# Patient Record
Sex: Male | Born: 1980 | Race: White | Hispanic: No | Marital: Married | Smoking: Never smoker
Health system: Southern US, Community
[De-identification: ages and names within clinical notes are randomized; demographics above are authoritative.]

## PROBLEM LIST (undated history)

## (undated) DIAGNOSIS — Z789 Other specified health status: Secondary | ICD-10-CM

## (undated) HISTORY — DX: Other specified health status: Z78.9

---

## 2017-01-04 ENCOUNTER — Encounter (INDEPENDENT_AMBULATORY_CARE_PROVIDER_SITE_OTHER): Payer: Self-pay

## 2017-01-11 ENCOUNTER — Encounter (INDEPENDENT_AMBULATORY_CARE_PROVIDER_SITE_OTHER): Payer: Self-pay | Admitting: Sports Medicine

## 2017-01-11 ENCOUNTER — Ambulatory Visit (INDEPENDENT_AMBULATORY_CARE_PROVIDER_SITE_OTHER): Payer: TRICARE Prime—HMO | Admitting: Sports Medicine

## 2017-01-11 VITALS — BP 139/86 | HR 69 | Temp 98.9°F | Ht 72.0 in | Wt 180.0 lb

## 2017-01-11 DIAGNOSIS — S73191A Other sprain of right hip, initial encounter: Secondary | ICD-10-CM

## 2017-01-11 DIAGNOSIS — M76891 Other specified enthesopathies of right lower limb, excluding foot: Secondary | ICD-10-CM

## 2017-01-11 MED ORDER — SODIUM HYALURONATE (VISCOSUP) 20 MG/2ML IX SOLN
20.0000 mg | INTRA_ARTICULAR | Status: AC
Start: 2017-01-11 — End: 2017-02-01
  Administered 2017-01-11: 15:00:00 20 mg via INTRA_ARTICULAR

## 2017-01-11 NOTE — Progress Notes (Signed)
[facility]      Provider: Zack Seal, MD  Date of Exam: 01/11/2017  Patient:  Brett Alexander  DOB: 18-Dec-1980  AGE: 36 y.o.  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 He recalls a history of twisting his hip 4-5 years ago while running, but states his pain from that event resolved. There was no new known inciting event, other than his marathon training. His pain is currently described as dull, aching and intermittent. It typically occurs when doing speed work. Light jogging and low impact exercise to not precipitate his symptoms. He reports associated weakness. He has tried physical therapy with a therapist in [place] as well as rest, ice, and NSAIDs with mild relief. He denies numbness, tingling, fevers, chills, redness. He has gotten an MRI of his hip which showed a labral tear and hamstring tendinitis.    Club/School Affiliation: none    Past Medical History:    Past Medical History:   Diagnosis Date   . Patient denies medical problems        Social History:    Social History   Substance Use Topics   . Smoking status: Never Smoker   . Smokeless tobacco: Never Used   . Alcohol use No       Family History:    Family History   Problem Relation Age of Onset   . Hypertension Mother        Past Surgical History:    Past Surgical History:   Procedure Laterality Date   . patient denies surgery         Medications:  No current outpatient prescriptions on file.    Current Facility-Administered Medications:   .  sodium hyaluronate (viscosup) (HYALGAN) injection 20 mg, 20 mg, Intra-articular, Weekly, Tanaiya Kolarik, Sydell Axon, MD, 20 mg at 01/11/17 1515    Allergies:  No Known Allergies    ROS:  All other systems were reviewed and are negative except as previously mentioned in the HPI.    EXAM:   BP 139/86   Pulse 69   Temp 98.9 F (37.2  C) (Oral)   Ht 1.829 m (6')   Wt 81.6 kg (180 lb)   BMI 24.41 kg/m   Constitutional: Pt is well-developed, well-nourished, and in no distress.   HENT:   Head: Normocephalic and atraumatic.   Eyes: Conjunctivae are normal.   Neck: Neck supple.   Cardiovascular: Normal rate  Pulmonary/Chest: Effort normal.   Neurological: Pt is alert and oriented to person, place, and time.   Skin: Skin is warm and dry. No rash noted. Pt is not diaphoretic.   Psychiatric: Affect normal.     Right Hip  Gait:  Normal  Observation:  normal  Range of Motion:  Flexion 120 Deg; Extension 30 Deg; Abduction 45 Deg; Internal Rotation 35 Deg; External Rotation 65 Deg  Palpation:  nontender at Gluteal region, Femoral Triangle, Greater Trochanter, Iliac Crest, ASIS, AIIS, Ischial tuberosity, Pubic Ramus, Pubic Symphysis  Leg Lengths: equal  Distal Sensory:  normal  Strength Testing: normal  Distal pulses:  normal   FABER:  negative  FADIR:  Mildly positive  Ober's:  negative     STUDIES:   I have obtained and reviewed the MRI of his right hip without  contrast, which shows a tear in the acetabular labrum and hamstring tendinitis.    ASSESSMENT/PLAN:   1. Tear of right acetabular labrum, initial encounter  Ambulatory referral to Physical Therapy   2. Hamstring tendinitis of right thigh  Ambulatory referral to Physical Therapy     Brett Alexander is a very pleasant 36 y.o. yo male with right hip pain, acetabular labral tear, and mild hamstring tendinopathy. We had a lengthy discussion about the various treatment options for this problem, including both the potential operative and non-operative treatments. The risks, benefits, alternatives, and nature of any potential procedures for the problem were also discussed. He has tried physical therapy, rest, NSAIDs and other conservative measures. Brett Alexander is not interested in surgical treatment options at this time. He would like to proceed with a trial of a cortisone injection into his hip to see if this  helps. The option of PRP and its experimental nature was also discussed today. He may consider this in the future.    Recommendations:  Relative rest and activity modification  ice / heat as needed for comfort  Physical therapy - Rx provided   Rest from running  Follow up for cortisone injection under u/s-guidance at [facility] at his convenience    Teena Dunk, MD [facility]  Primary Care Sports Medicine Physician  [facility]

## 2017-01-19 ENCOUNTER — Encounter (INDEPENDENT_AMBULATORY_CARE_PROVIDER_SITE_OTHER): Payer: Self-pay | Admitting: Sports Medicine

## 2017-01-19 ENCOUNTER — Ambulatory Visit (INDEPENDENT_AMBULATORY_CARE_PROVIDER_SITE_OTHER): Payer: TRICARE Prime—HMO | Admitting: Sports Medicine

## 2017-01-19 VITALS — BP 138/77 | HR 69 | Ht 72.0 in | Wt 180.0 lb

## 2017-01-19 DIAGNOSIS — S73191D Other sprain of right hip, subsequent encounter: Secondary | ICD-10-CM

## 2017-01-19 MED ORDER — TRIAMCINOLONE ACETONIDE 40 MG/ML IJ SUSP
40.0000 mg | Freq: Once | INTRAMUSCULAR | Status: AC
Start: 2017-01-19 — End: 2017-01-19
  Administered 2017-01-19: 09:00:00 40 mg via INTRA_ARTICULAR

## 2017-01-19 NOTE — Progress Notes (Signed)
Ultrasound-guided Injection Procedure Note     Brief History: OUSMAN DISE is a very pleasant 36 y.o. yo male with right hip pain, acetabular labral tear.    Procedure:  Pre-procedure Diagnosis: right acetabular labral tear  Post-procedure Diagnosis: (same)  Indications: Treatment of symptomatic right hip pain; therapeutic purposes    Reason for ultrasound guidance: Required for localization of hip joint, avoidance of neurovascular structures, needle guidance during the injection, patient comfort.    Verbal informed consent was obtained prior to the procedure. Prior to this, the risks and side effects were discussed, including, but not limited to pain, infection, allergic reaction, bleeding, damage to tissues, vessels and nerves, skin atrophy, steroid flare.     Using the GE [employer] e [place] system, I performed a ULTRASOUND guided Injection of the right hip via an anterior in-plane approach. Localization of nearby nerves, tendons, and pertinent vasculature was performed prior to the procedure, taking care to avoid these structures. The site was prepped with chlorhexidine x2 and ethyl chloride spray. Injection was performed using needle localization with the curvilinear probe in long axis and a 22 ga spinal needle. 1.0 mL of 40mg /mL Kenalog and 2 mL of 1% lidocaine without epi was then injected into the joint. Permanent recording of images was performed. Pt reported mild to moderate relief immediately following the injection.    Complications: None; patient tolerated the procedure well.     Follow-up: Post-procedure education and instructions were provided. The patient was instructed to follow up in 6 weeks or sooner as needed for worsening symptoms.    Teena Dunk, MD [facility]  Primary Care Sports Medicine Physician  [facility]

## 2017-03-01 ENCOUNTER — Encounter (INDEPENDENT_AMBULATORY_CARE_PROVIDER_SITE_OTHER): Payer: Self-pay | Admitting: Sports Medicine

## 2017-03-01 ENCOUNTER — Ambulatory Visit (INDEPENDENT_AMBULATORY_CARE_PROVIDER_SITE_OTHER): Payer: TRICARE Prime—HMO | Admitting: Sports Medicine

## 2017-03-01 VITALS — BP 125/86 | HR 55 | Ht 72.0 in | Wt 180.0 lb

## 2017-03-01 DIAGNOSIS — M76891 Other specified enthesopathies of right lower limb, excluding foot: Secondary | ICD-10-CM

## 2017-03-01 DIAGNOSIS — S73191D Other sprain of right hip, subsequent encounter: Secondary | ICD-10-CM

## 2017-03-01 NOTE — Progress Notes (Signed)
[facility]      Provider: Zack Seal, MD  Date of Exam: 03/01/2017  Patient:  Brett Alexander  DOB: 05-25-1981  AGE: 36 y.o.  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 At his last visit in June, he was given an intra-articular cortisone injection. He feels as though this, along with the PT, have helped with his symptoms. In fact, he denies any pain currently. However, he still feels as though his hip is "tight" at times. He is back to running about 40 miles per week without pain. His goal is to run the [facility].    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 He recalls a history of twisting his hip 4-5 years ago while running, but states his pain from that event resolved. There was no new known inciting event, other than his marathon training. His pain is currently described as dull, aching and intermittent. It typically occurs when doing speed work. Light jogging and low impact exercise to not precipitate his symptoms. He reports associated weakness. He has tried physical therapy with a therapist in [place] as well as rest, ice, and NSAIDs with mild relief. He denies numbness, tingling, fevers, chills, redness. He has gotten an MRI of his hip which showed a labral tear and hamstring tendinitis.    Club/School Affiliation: none    Past Medical History:    Past Medical History:   Diagnosis Date   . Patient denies medical problems        Social History:    Social History   Substance Use Topics   . Smoking status: Never Smoker   . Smokeless tobacco: Never Used   . Alcohol use Yes      Comment: rarely       Family History:    Family History   Problem Relation Age of Onset   . Hypertension Mother        Past Surgical History:     Past Surgical History:   Procedure Laterality Date   . patient denies surgery         Medications:  No current outpatient prescriptions on file.    Allergies:  No Known Allergies    ROS:  All other systems were reviewed and are negative except as previously mentioned in the HPI.    EXAM:   BP 125/86   Pulse (!) 55   Ht 1.829 m (6')   Wt 81.6 kg (180 lb)   BMI 24.41 kg/m   Constitutional: Pt is well-developed, well-nourished, and in no distress.   HENT:   Head: Normocephalic and atraumatic.   Eyes: Conjunctivae are normal.   Neck: Neck supple.   Cardiovascular: Normal rate  Pulmonary/Chest: Effort normal.   Neurological: Pt is alert and oriented to person, place, and time.   Skin: Skin is warm and dry. No rash noted. Pt is not diaphoretic.   Psychiatric: Affect normal.     Right Hip  Gait:  Normal  Observation:  normal  Range of Motion:  Flexion 120 Deg; Extension 30 Deg; Abduction 45 Deg; Internal Rotation 35 Deg; External Rotation 65 Deg  Palpation:  nontender throughout  Leg Lengths: equal  Distal Sensory:  normal  Strength Testing: normal  Distal pulses:  normal   FABER:  negative  FADIR:  negative  Ober's:  negative     STUDIES:   Prior MRI of his right hip without contrast, shows a tear in the acetabular labrum and hamstring tendinitis.    ASSESSMENT/PLAN:   1. Tear of right acetabular labrum, subsequent encounter     2. Hamstring tendinitis of right thigh       Brett Alexander is a very pleasant 36 y.o. yo male with right hip pain, acetabular labral tear, and mild hamstring tendinopathy. He has made good progress with non-operative care.     Recommendations:  Relative rest and activity modification  ice / heat as needed for comfort  Continue PT/HEP  Low impact exercise encouraged  Follow up as needed    Teena Dunk, MD [facility]  Primary Care Sports Medicine Physician  [facility]

## 2017-09-04 ENCOUNTER — Ambulatory Visit (INDEPENDENT_AMBULATORY_CARE_PROVIDER_SITE_OTHER): Payer: TRICARE Prime—HMO | Admitting: Sports Medicine

## 2017-09-04 ENCOUNTER — Encounter (INDEPENDENT_AMBULATORY_CARE_PROVIDER_SITE_OTHER): Payer: Self-pay | Admitting: Sports Medicine

## 2017-09-04 VITALS — BP 121/74 | HR 64 | Ht 72.0 in | Wt 180.0 lb

## 2017-09-04 DIAGNOSIS — M25872 Other specified joint disorders, left ankle and foot: Secondary | ICD-10-CM

## 2017-09-04 NOTE — Progress Notes (Signed)
[facility]      Provider: Zack Seal, MD  Date of Exam: 09/04/2017  Patient:  Brett Alexander  DOB: [birth year]  AGE: 37 y.o.  MR#:  [id number]    CC: left ankle pain    HPI:  Brett Alexander is a 37 y.o.-year-old male who presents today for an evaluation of his left anterior ankle pain which started 2-3 weeks ago. His pain was achy and anterior. He has been doing his regular marathon training, but there was no known traumatic event. His pain lasted for a couple of days before resolving on its own. X-rays were performed by his PCP and showed an anterior bony spur. He was told to follow-up with sports medicine and is here seeking a second opinion about the possible need for surgery or other intervention.    Club/School Affiliation: none    Past Medical History:    Past Medical History:   Diagnosis Date   . Patient denies medical problems        Past Surgical History:    Past Surgical History:   Procedure Laterality Date   . patient denies surgery         Social History:   Social History   Substance Use Topics   . Smoking status: Never Smoker   . Smokeless tobacco: Never Used   . Alcohol use Yes      Comment: rarely       ROS:  All other systems were reviewed and are negative except as previously mentioned in the HPI.    EXAM:   BP 121/74   Pulse 64   Ht 1.829 m (6')   Wt 81.6 kg (180 lb)   BMI 24.41 kg/m   Constitutional: Pt is well-developed, well-nourished, and in no distress.   HENT:   Head: Normocephalic and atraumatic.   Eyes: Conjunctivae are normal.   Neck: Neck supple.   Cardiovascular: Normal rate  Pulmonary/Chest: Effort normal.   Neurological: Pt is alert and oriented to person, place, and time.   Skin: Skin is warm and dry. Pt is not diaphoretic.   Psychiatric: Affect normal.     Left Ankle  Observation: normal gait; no swelling, deformity or ecchymosis  Plantar Arches: pes planus  Palpation: Small bony spur palpated over the talus anteriorly; nontender over lateral  malleolus, medial malleolus, base of 5th Metatarsal, Lis Franc joint, ATFL, peroneal tendons, proximal Fibula, posterior tibial tendon, anterior tib-fib ligament, CFL, cuboid, achilles tendon, retrocalcaneal bursa  ROM: Dorsiflexion 15 Deg; Plantar Flexion 50 Deg ; Inversion 5 Deg; Eversion 5 Deg  Drawer Test: Negative  Cotton Test: negative  Tib-Fib Squeeze Test: negative  Talar Tilt Test: negative  External Rotation: negative  Dorsolateral Compression: negative  Strength testing:                                  Plantar flexion - 5 / 5                                  Eversion - 5 / 5                                  Inversion - 5 / 5  Dorsiflexion - 5 / 5   Thompson's Test: normal  Too Many Toes Sign: negative  Rise on Toe: normal heel varus  Distal Sensory: normal  Tinel's: negative over tarsal tunnel  Distal pulses:  Intact    STUDIES: None new. Prior x-rays of the left ankle reportedly showed an anterior bony spur, but were otherwise unremarkable. No records are currently available.    ASSESSMENT/PLAN:   1. Impingement of left ankle joint       Brett Alexander is a very pleasant 37 y.o. yo male with left anterior bony impingement, resolved. We had a lengthy discussion about the various treatment options for this problem, including both the potential operative and non-operative treatments. The risks, benefits, alternatives, and nature of any potential procedures for the problem were also discussed.    Recommendations:  Relative rest and activity modification  ice / heat as needed for comfort   Yoga, work on posterior heel cord flexibility  Work on gluteus medius/minimus strengthening  Dry needling as needed  Orthotics  Follow up as needed if symptoms return    Teena Dunk, MD, [facility]  Primary Care Sports Medicine Physician  [facility] Medicine

## 2021-10-04 IMAGING — MR MRI HIP RT WO CONTRAST
5 series · 40 of 40 positions shown · non-contrast
Comparison: None.

INDICATION: Right hip pain.
TECHNIQUE: Multiplanar, multiecho MR imaging of the hip labrum protocol was performed, including T1-weighted and fluid-sensitive sequences.

[Series 2: t1_cor_obl · coronal · right · 3.0mm · 0.56mm/px · 7 of 22 slices shown]
[im 1/22]
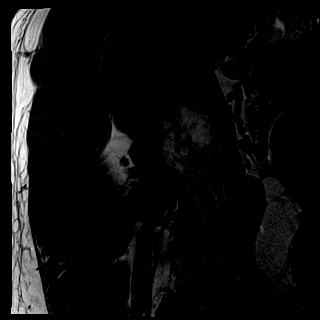
[im 4/22]
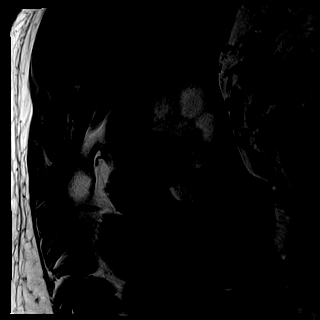
[im 8/22]
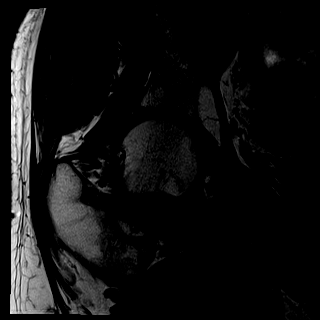
[im 11/22]
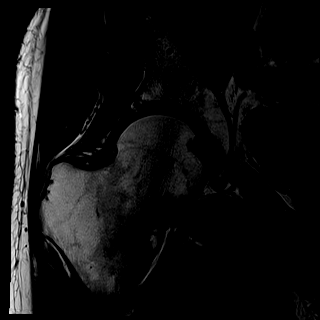
[im 15/22]
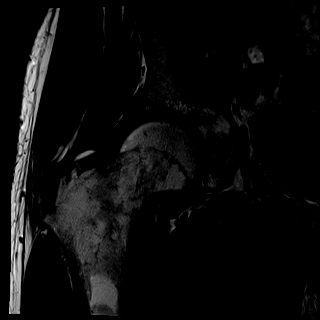
[im 18/22]
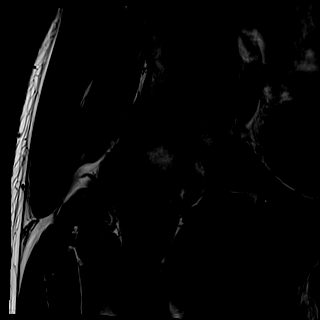
[im 22/22]
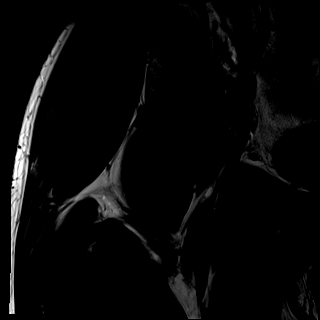

[Series 3: pd_cor_obl_fs · coronal · right · 3.0mm · 0.56mm/px · 6 of 22 slices shown]
[im 1/22]
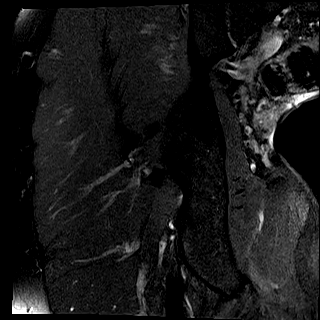
[im 5/22]
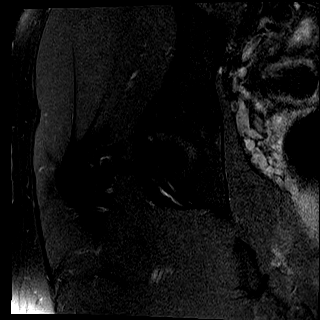
[im 9/22]
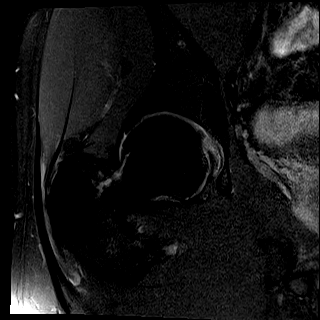
[im 13/22]
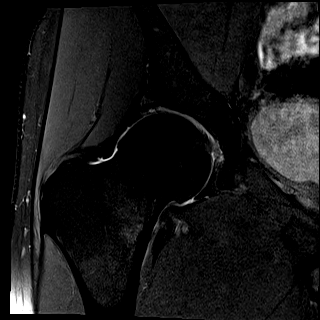
[im 17/22]
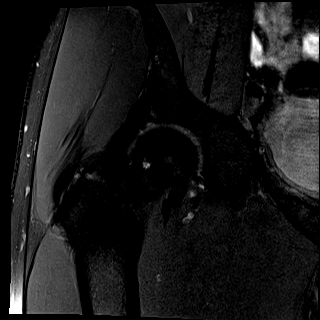
[im 22/22]
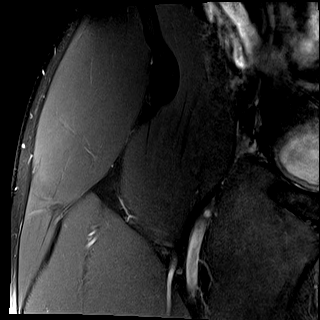

[Series 4: pd_sag_obl_fs · sagittal · right · 3.0mm · 0.56mm/px · 9 of 32 slices shown]
[im 1/32]
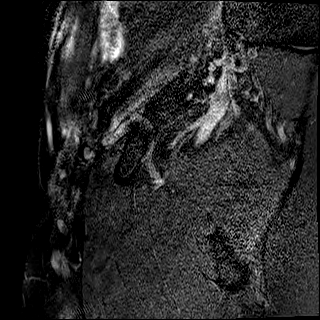
[im 4/32]
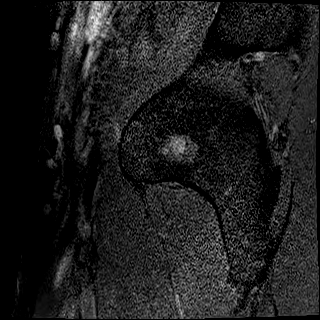
[im 8/32]
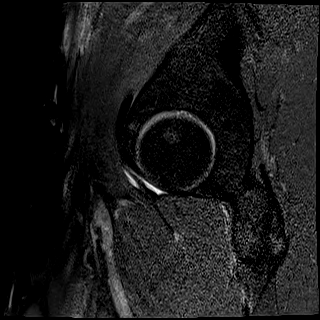
[im 12/32]
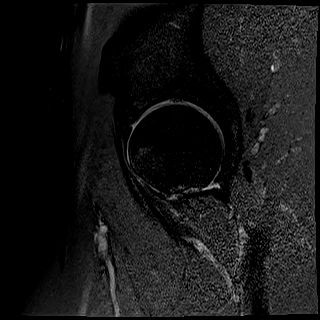
[im 16/32]
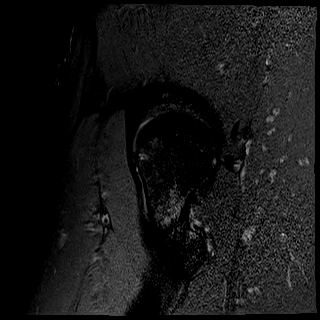
[im 20/32]
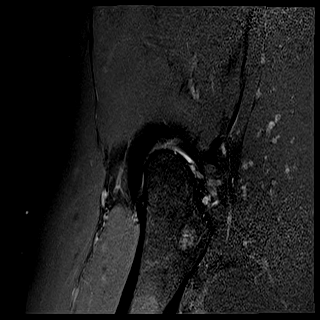
[im 24/32]
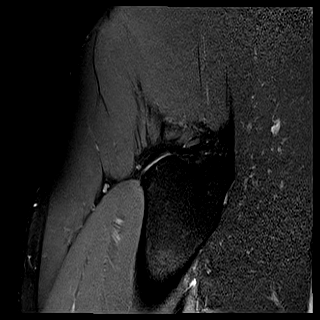
[im 28/32]
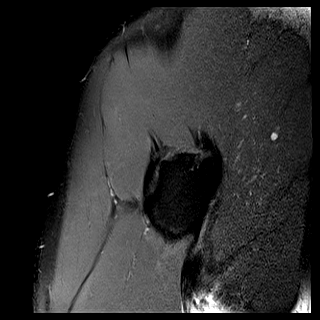
[im 32/32]
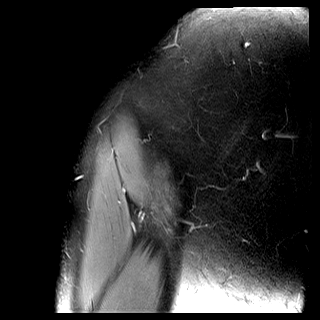

[Series 5: pd_axial_obl_fs · oblique · right · 3.0mm · 0.56mm/px · 6 of 22 slices shown]
[im 1/22]
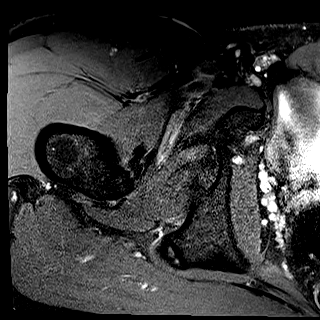
[im 5/22]
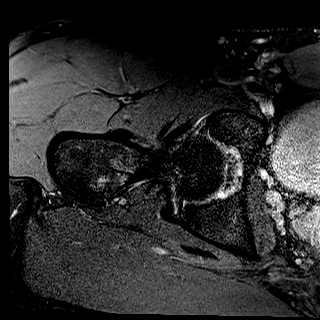
[im 9/22]
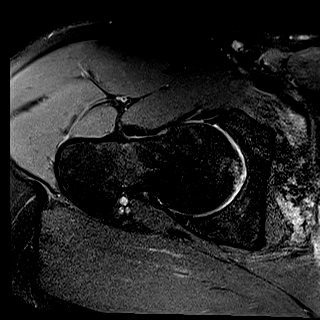
[im 13/22]
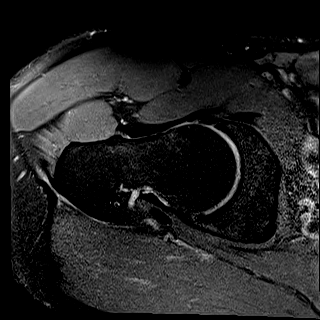
[im 17/22]
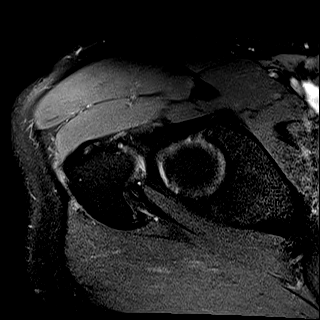
[im 22/22]
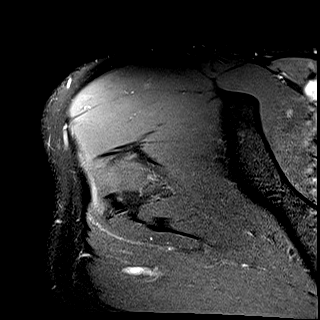

[Series 6: t2_axial_fs · axial · right · 4.0mm · 0.62mm/px · z∈[-76,+139]mm · 12 of 44 slices shown]
[im 1/44]
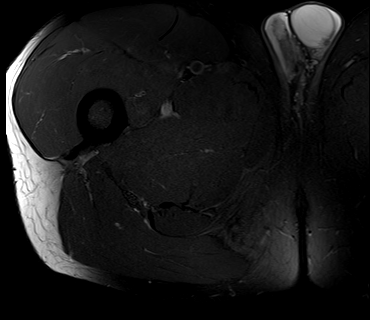
[im 4/44]
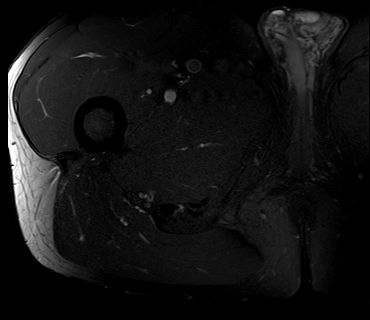
[im 8/44]
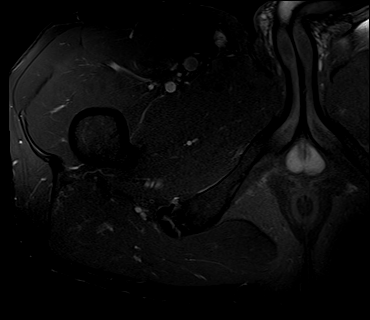
[im 12/44]
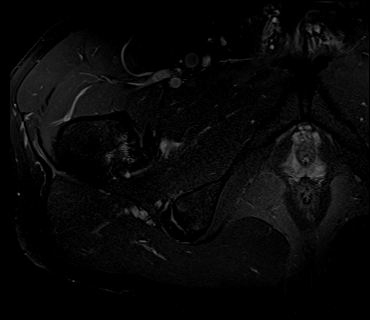
[im 16/44]
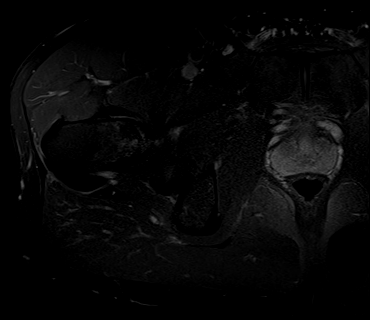
[im 20/44]
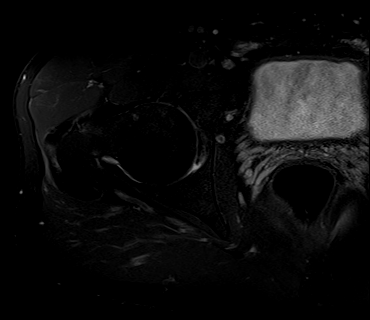
[im 24/44]
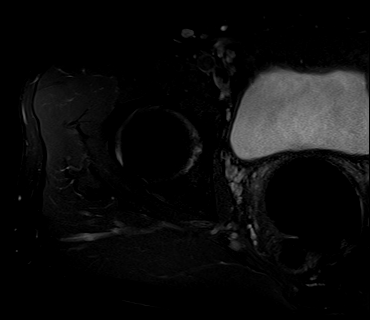
[im 28/44]
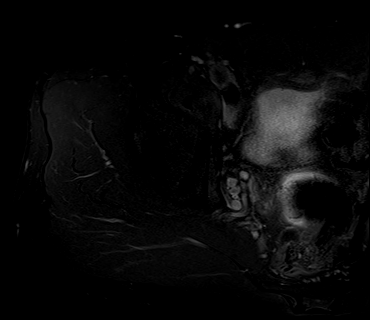
[im 32/44]
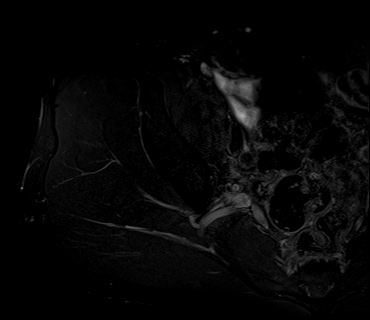
[im 36/44]
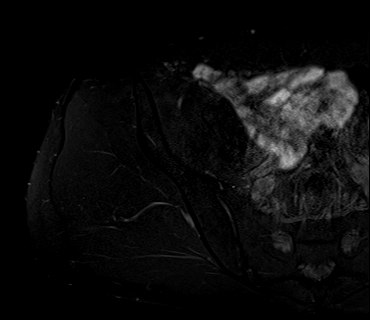
[im 40/44]
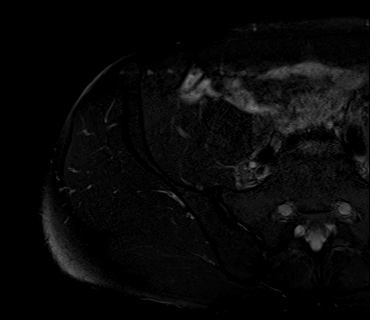
[im 44/44]
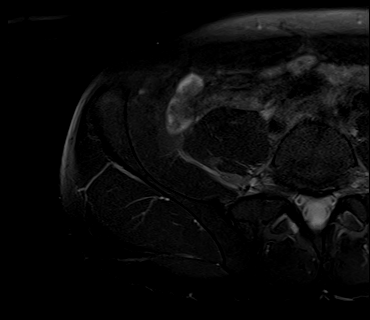

[40 of 40 positions shown; findings below may reference images not displayed]

FINDINGS: Acetabular labrum: Tear of the anterior superior labrum.

Cartilage: Small low-grade partial-thickness chondral defects at the anterosuperior acetabulum, at the chondral-labral junction.

Marrow: No fracture. No erosion. No AVN.

Osseous morphology: Mild cam morphology of the proximal right femur. Normal coverage of the femoral head by the acetabulum. Normal acetabular version.

Gluteal tendons and trochanteric bursa: Intact and normal in appearance.

Other visualized tendons: Low-grade partial-thickness tearing of the hamstring tendon origin. Iliopsoas and rectus femoris tendons are intact.

Soft tissues: Other visualized soft tissues are unremarkable.

No free fluid the pelvis. No right inguinal adenopathy or hernia.
IMPRESSION: 1.
Tear of the anterior superior right hip labrum.

2.
Mild right hip chondral abnormalities.

3.
Mild cam morphology of the proximal right femur.

4.
Low-grade partial-thickness tearing of the right hamstring tendon origin.

## 2023-03-02 IMAGING — MR MRI HIP LT WO CONTRAST
5 series · 40 of 40 positions shown · non-contrast
Comparison: None.

Images Obtained from [facility]
MRI HIP LT WO CONTRAST
INDICATION: Pain in left hip.
TECHNIQUE: Multiplanar, multiecho imaging of the left hip was performed, including T1-weighted and fluid sensitive sequences without intravenous contrast.

[Series 2: t1_cor · coronal · left · 4.0mm · 0.66mm/px · 7 of 28 slices shown]
[im 1/28]
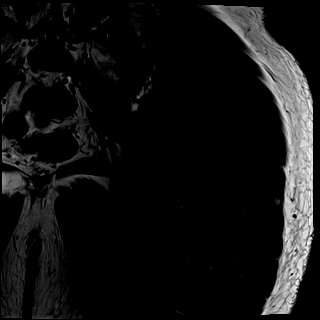
[im 5/28]
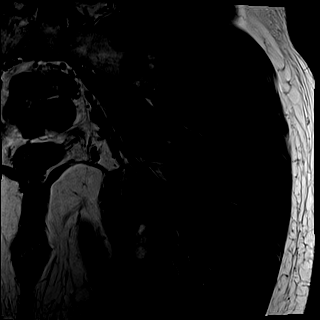
[im 10/28]
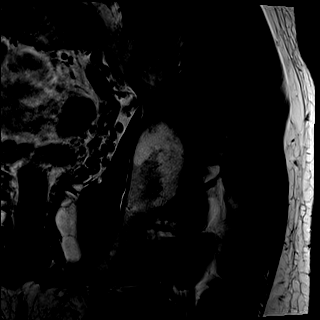
[im 14/28]
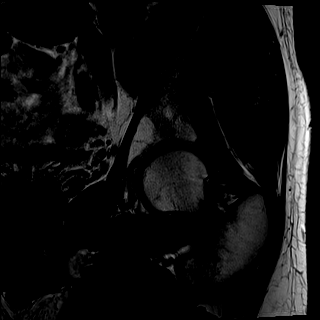
[im 19/28]
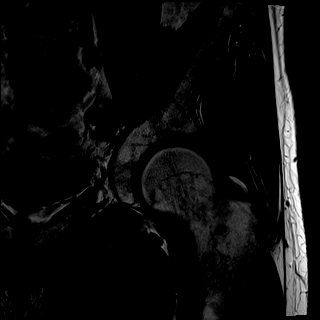
[im 23/28]
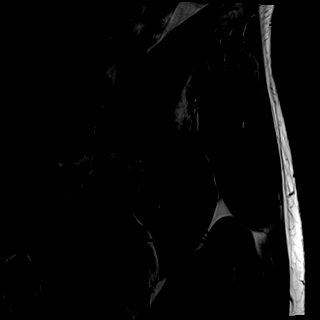
[im 28/28]
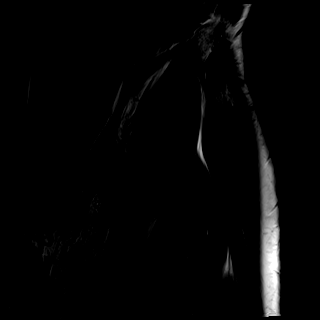

[Series 3: t2_cor_fs · coronal · left · 4.0mm · 0.66mm/px · 7 of 28 slices shown]
[im 1/28]
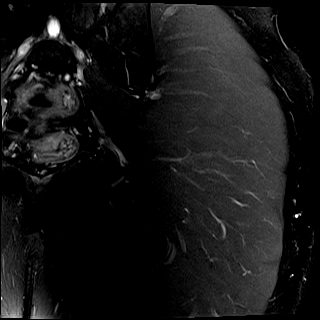
[im 5/28]
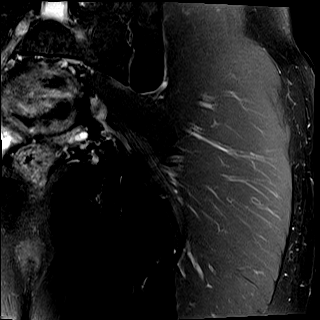
[im 10/28]
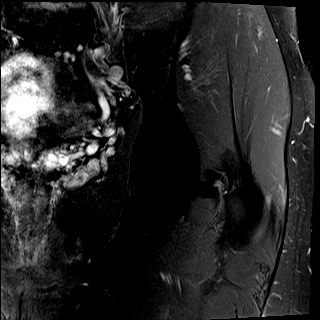
[im 14/28]
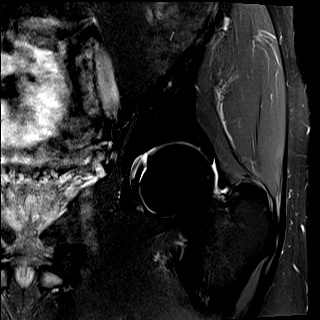
[im 19/28]
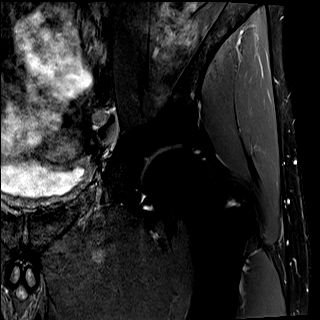
[im 23/28]
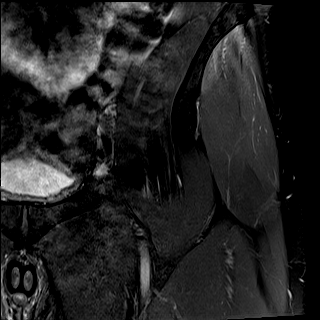
[im 28/28]
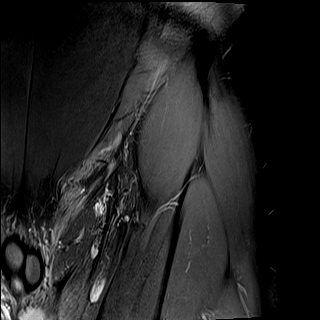

[Series 4: t2_axial_fs · axial · left · 4.0mm · 0.62mm/px · z∈[-102,+93]mm · 10 of 40 slices shown]
[im 1/40]
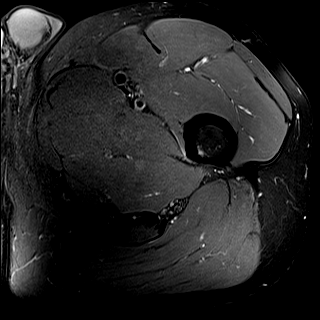
[im 5/40]
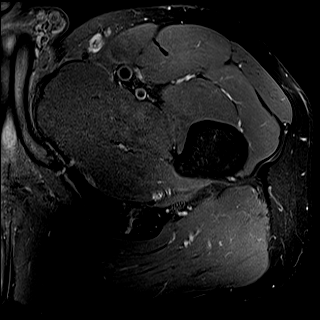
[im 9/40]
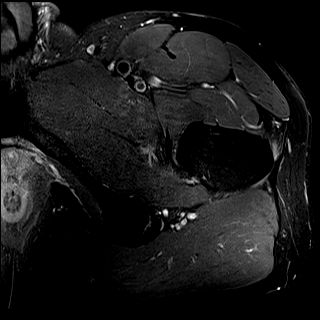
[im 14/40]
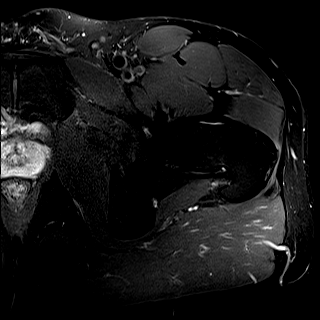
[im 18/40]
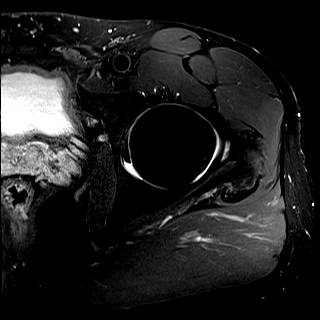
[im 22/40]
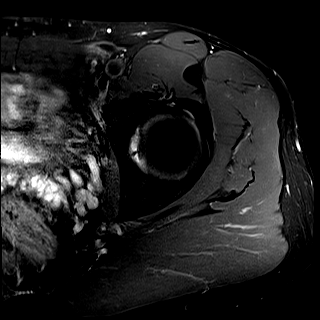
[im 27/40]
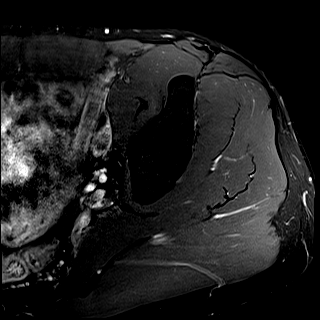
[im 31/40]
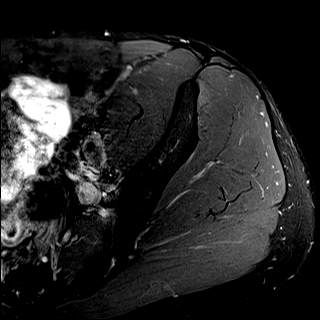
[im 35/40]
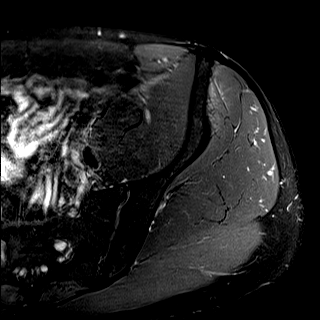
[im 40/40]
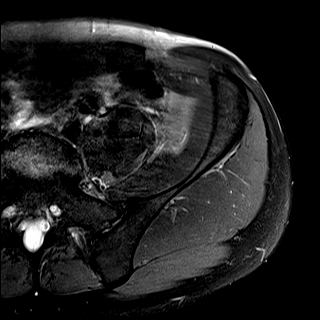

[Series 5: t1_axial · axial · left · 4.0mm · 0.62mm/px · z∈[-102,+93]mm · 10 of 40 slices shown]
[im 1/40]
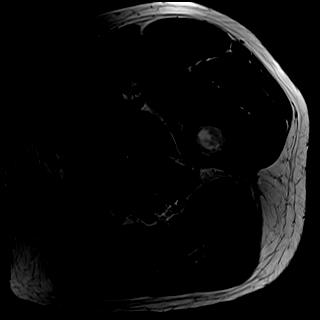
[im 5/40]
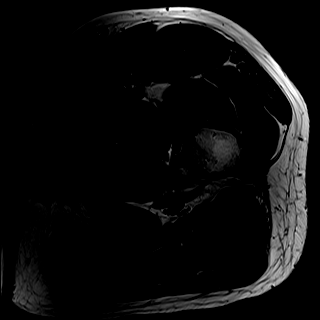
[im 9/40]
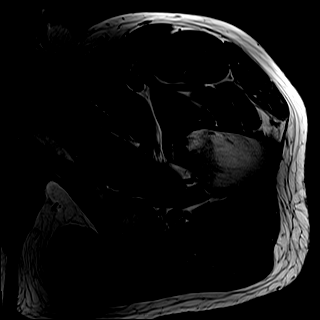
[im 14/40]
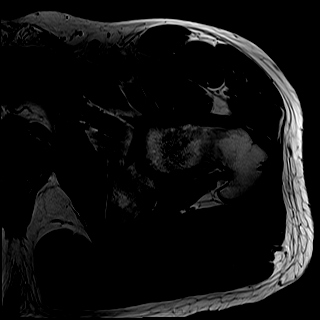
[im 18/40]
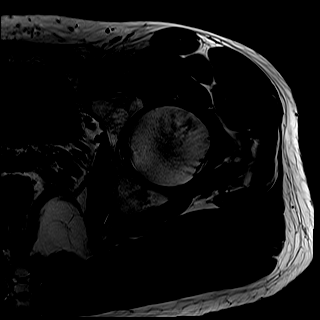
[im 22/40]
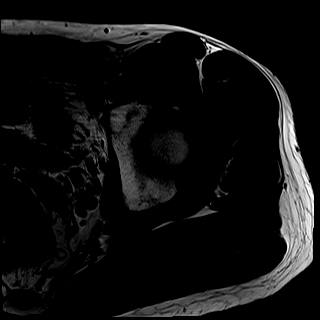
[im 27/40]
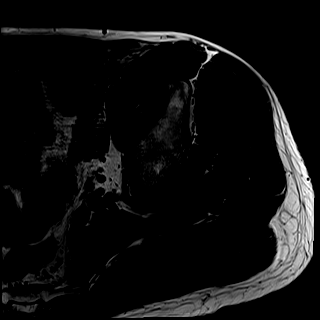
[im 31/40]
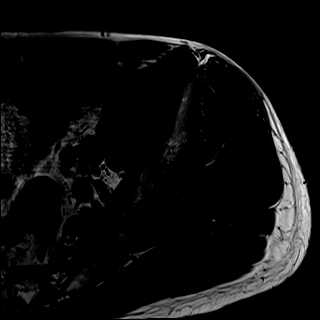
[im 35/40]
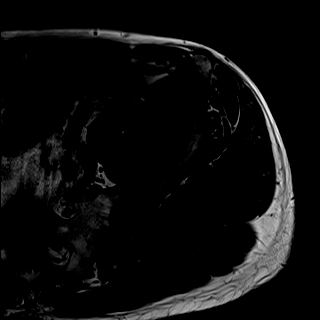
[im 40/40]
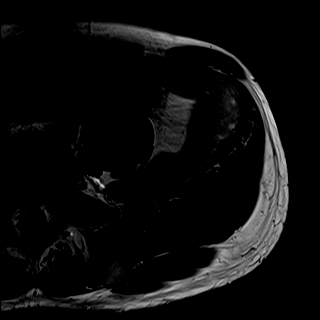

[Series 6: t2_sag_fs · sagittal · left · 4.0mm · 0.62mm/px · 6 of 26 slices shown]
[im 1/26]
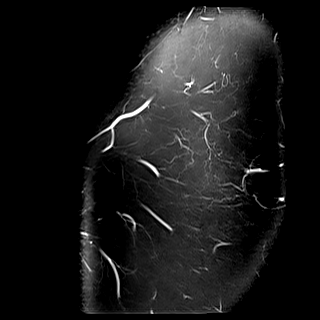
[im 6/26]
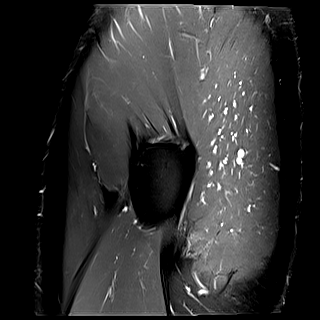
[im 11/26]
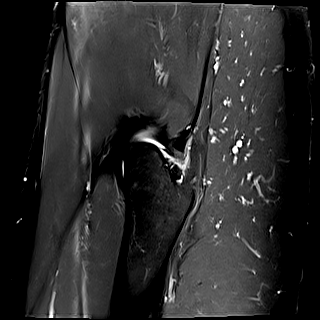
[im 16/26]
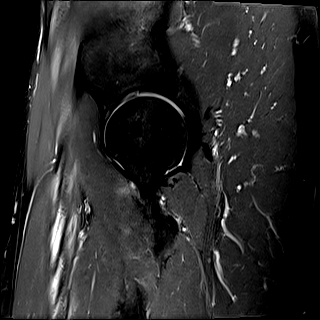
[im 21/26]
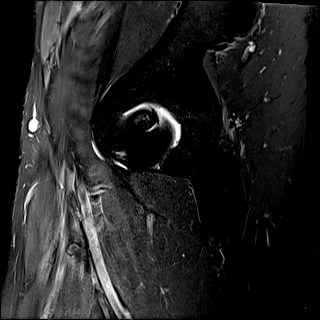
[im 26/26]
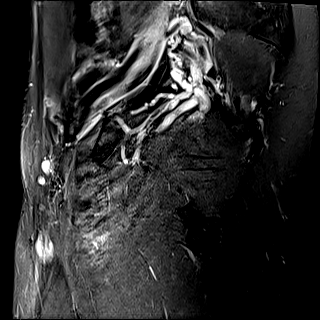

[40 of 40 positions shown; findings below may reference images not displayed]

FINDINGS: Marrow: Normal; no fracture or femoral head AVN.
Visualized SI joints and pubic symphysis: Normal.
Hip joints: No joint effusion. Cam morphology of the proximal left femur. Tear of the anterior superior labrum. Ligamentum teres intact. No chondral defects.
Tendons: Mild tendinosis of the hamstring origin. The psoas and rectus femoris tendons are intact. Gluteal tendons are intact. No trochanteric bursitis.
Soft tissues: Normal.
Other: No additional findings.
IMPRESSION: 1.  Tear of the anterior superior left hip labrum.
2.  No left hip chondral defects.
3.  Cam morphology of the proximal left femur.
4.  Mild tendinosis of the left hamstring origin.
